# Patient Record
Sex: Female | Born: 1990 | Race: White | Hispanic: No | Marital: Single
Health system: Southern US, Community
[De-identification: ages and names within clinical notes are randomized; demographics above are authoritative.]

## PROBLEM LIST (undated history)

## (undated) ENCOUNTER — Emergency Department (HOSPITAL_COMMUNITY): Payer: Self-pay | Source: Home / Self Care

---
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
## 2008-03-07 ENCOUNTER — Emergency Department: Payer: Self-pay | Admitting: Emergency Medicine

## 2008-04-11 ENCOUNTER — Emergency Department: Payer: Self-pay | Admitting: Emergency Medicine

## 2009-01-13 ENCOUNTER — Observation Stay: Payer: Self-pay

## 2009-01-15 ENCOUNTER — Observation Stay: Payer: Self-pay

## 2009-01-18 ENCOUNTER — Inpatient Hospital Stay: Payer: Self-pay

## 2009-11-22 ENCOUNTER — Emergency Department: Payer: Self-pay | Admitting: Unknown Physician Specialty

## 2010-04-08 ENCOUNTER — Ambulatory Visit: Payer: Self-pay | Admitting: Family Medicine

## 2012-03-31 ENCOUNTER — Emergency Department: Payer: Self-pay | Admitting: Emergency Medicine

## 2012-03-31 LAB — URINALYSIS, COMPLETE
Bacteria: NONE SEEN
Glucose,UR: NEGATIVE mg/dL (ref 0–75)
Leukocyte Esterase: NEGATIVE
Nitrite: NEGATIVE
RBC,UR: 4 /HPF (ref 0–5)
Squamous Epithelial: 1
WBC UR: 2 /HPF (ref 0–5)

## 2012-03-31 LAB — CBC
MCH: 32.7 pg (ref 26.0–34.0)
MCHC: 34.7 g/dL (ref 32.0–36.0)
MCV: 94 fL (ref 80–100)
Platelet: 210 10*3/uL (ref 150–440)
RBC: 4.17 10*6/uL (ref 3.80–5.20)
RDW: 12.4 % (ref 11.5–14.5)

## 2012-11-08 ENCOUNTER — Ambulatory Visit: Payer: Self-pay | Admitting: Obstetrics and Gynecology

## 2012-11-08 LAB — CBC WITH DIFFERENTIAL/PLATELET
Basophil #: 0 10*3/uL (ref 0.0–0.1)
Eosinophil %: 1.2 %
HCT: 33.1 % — ABNORMAL LOW (ref 35.0–47.0)
HGB: 11.5 g/dL — ABNORMAL LOW (ref 12.0–16.0)
Lymphocyte #: 2 10*3/uL (ref 1.0–3.6)
Lymphocyte %: 20.6 %
Monocyte #: 0.5 x10 3/mm (ref 0.2–0.9)
Monocyte %: 5.6 %
Neutrophil #: 6.9 10*3/uL — ABNORMAL HIGH (ref 1.4–6.5)
Neutrophil %: 72.4 %
Platelet: 293 10*3/uL (ref 150–440)
RBC: 3.76 10*6/uL — ABNORMAL LOW (ref 3.80–5.20)
RDW: 13.8 % (ref 11.5–14.5)

## 2012-11-09 ENCOUNTER — Inpatient Hospital Stay: Payer: Self-pay | Admitting: Obstetrics and Gynecology

## 2012-11-10 LAB — HEMATOCRIT: HCT: 27.4 % — ABNORMAL LOW (ref 35.0–47.0)

## 2013-07-28 ENCOUNTER — Emergency Department: Payer: Self-pay | Admitting: Emergency Medicine

## 2013-07-28 LAB — COMPREHENSIVE METABOLIC PANEL
ALT: 18 U/L (ref 12–78)
Albumin: 4.3 g/dL (ref 3.4–5.0)
Alkaline Phosphatase: 48 U/L
Anion Gap: 7 (ref 7–16)
BUN: 10 mg/dL (ref 7–18)
Bilirubin,Total: 0.4 mg/dL (ref 0.2–1.0)
CALCIUM: 8.8 mg/dL (ref 8.5–10.1)
CO2: 24 mmol/L (ref 21–32)
Chloride: 109 mmol/L — ABNORMAL HIGH (ref 98–107)
Creatinine: 0.69 mg/dL (ref 0.60–1.30)
EGFR (Non-African Amer.): >60
Glucose: 114 mg/dL — ABNORMAL HIGH (ref 65–99)
OSMOLALITY: 279 (ref 275–301)
Potassium: 3.9 mmol/L (ref 3.5–5.1)
SGOT(AST): 11 U/L — ABNORMAL LOW (ref 15–37)
SODIUM: 140 mmol/L (ref 136–145)
Total Protein: 7 g/dL (ref 6.4–8.2)

## 2013-07-28 LAB — URINALYSIS, COMPLETE
BILIRUBIN, UR: NEGATIVE
BLOOD: NEGATIVE
Bacteria: NONE SEEN
GLUCOSE, UR: NEGATIVE mg/dL (ref 0–75)
LEUKOCYTE ESTERASE: NEGATIVE
NITRITE: NEGATIVE
Ph: 6 (ref 4.5–8.0)
Protein: NEGATIVE
RBC,UR: <1 /HPF (ref 0–5)
SPECIFIC GRAVITY: 1.02 (ref 1.003–1.030)
Squamous Epithelial: 2

## 2013-07-28 LAB — CBC WITH DIFFERENTIAL/PLATELET
BASOS PCT: 0.5 %
Basophil #: 0 10*3/uL (ref 0.0–0.1)
Eosinophil #: 0.2 10*3/uL (ref 0.0–0.7)
Eosinophil %: 2.8 %
HCT: 43.1 % (ref 35.0–47.0)
HGB: 14.7 g/dL (ref 12.0–16.0)
Lymphocyte #: 2.3 10*3/uL (ref 1.0–3.6)
Lymphocyte %: 35.6 %
MCH: 31.8 pg (ref 26.0–34.0)
MCHC: 34 g/dL (ref 32.0–36.0)
MCV: 93 fL (ref 80–100)
MONOS PCT: 4.7 %
Monocyte #: 0.3 x10 3/mm (ref 0.2–0.9)
NEUTROS PCT: 56.4 %
Neutrophil #: 3.6 10*3/uL (ref 1.4–6.5)
Platelet: 220 10*3/uL (ref 150–440)
RBC: 4.62 10*6/uL (ref 3.80–5.20)
RDW: 12.7 % (ref 11.5–14.5)
WBC: 6.4 10*3/uL (ref 3.6–11.0)

## 2013-07-28 LAB — LIPASE, BLOOD: Lipase: 98 U/L (ref 73–393)

## 2013-07-28 LAB — WET PREP, GENITAL

## 2013-11-18 IMAGING — US US OB < 14 WEEKS - US OB TV
1 series · 14 of 28 positions shown · non-contrast
Comparison: none

REASON FOR EXAM: new diagnosis of pregnancy, pelvic pain vaginal bleeding
COMMENTS:

PROCEDURE:     US  - US OB LESS THAN 14 WEEKS/W TRANS  - March 31, 2012  [DATE]
RESULT:     Comparison: None
TECHNIQUE: Multiple transabdominal gray-scale images and endovaginal
gray-scale images of the pelvis performed.

[Series 1: us ob < 14 weeks - us ob tv · 0.21mm/px · 14 of 39 slices shown]
[im 2/39]
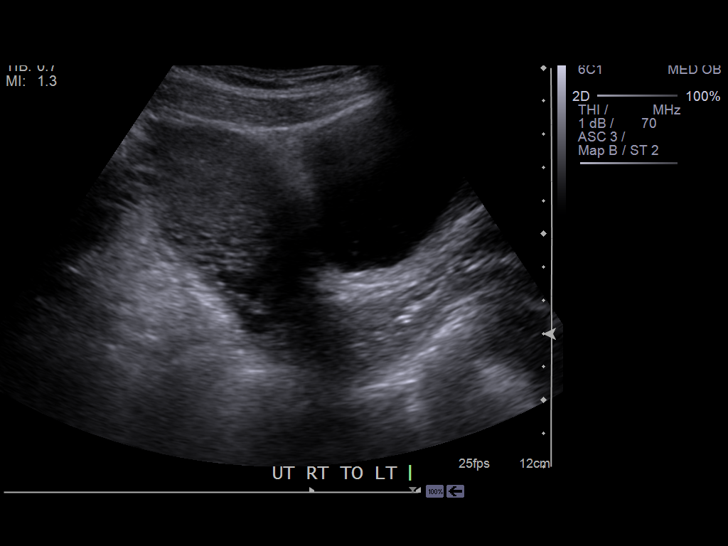
[im 5/39]
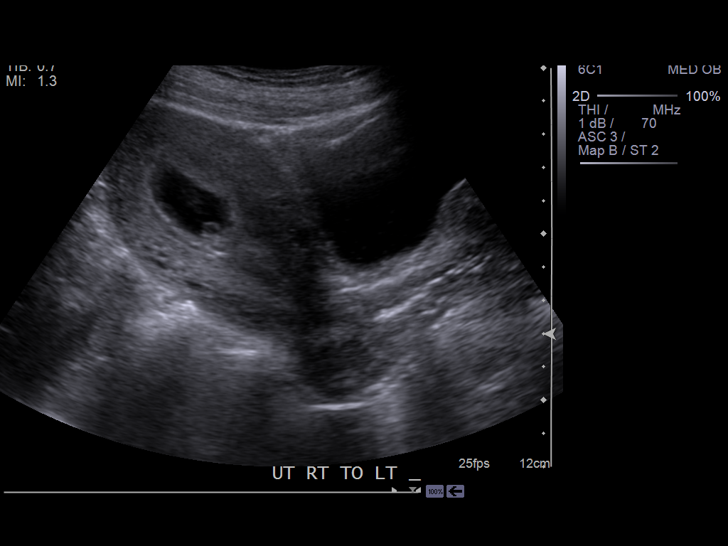
[im 8/39]
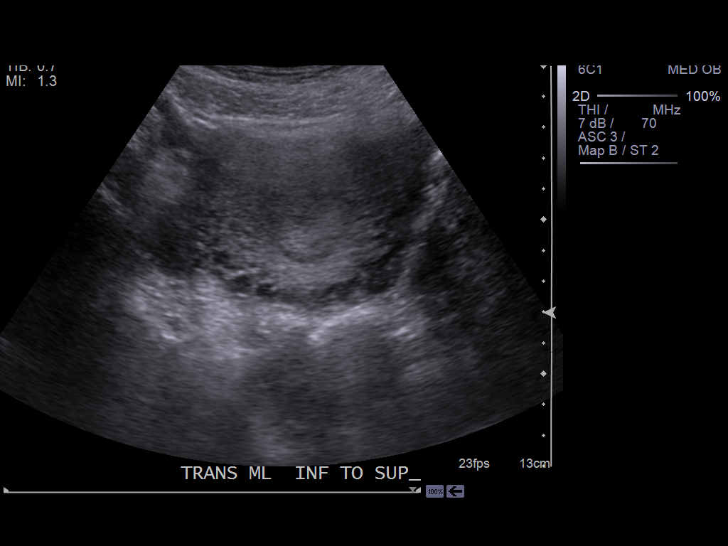
[im 10/39]
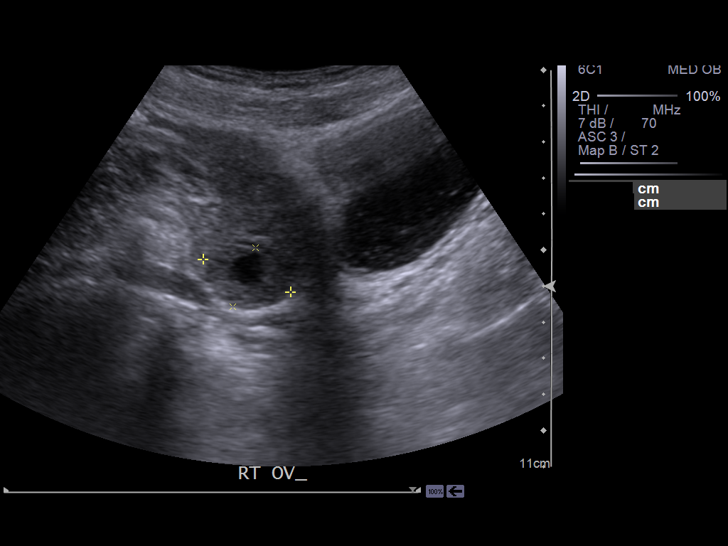
[im 13/39]
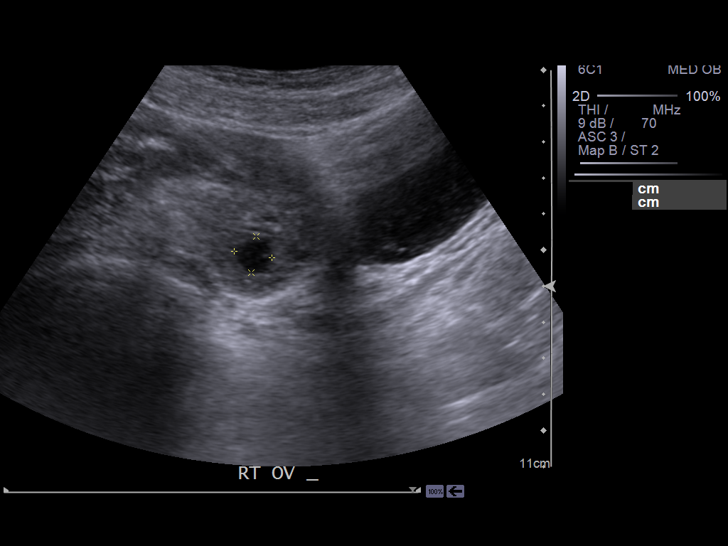
[im 16/39]
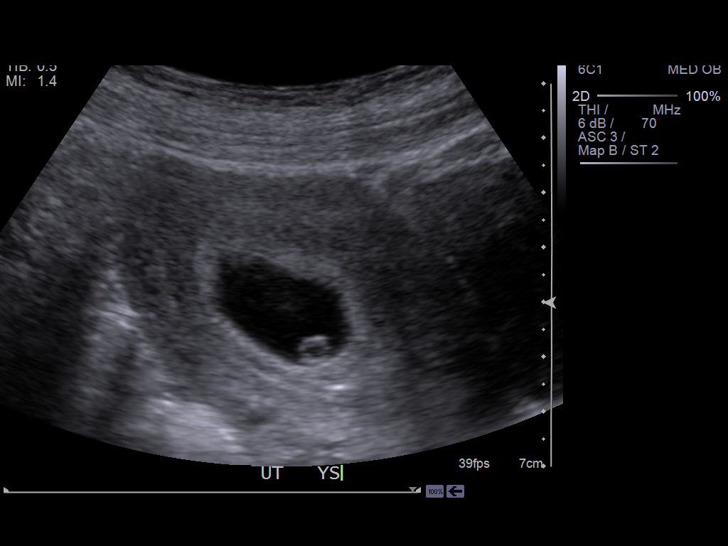
[im 19/39]
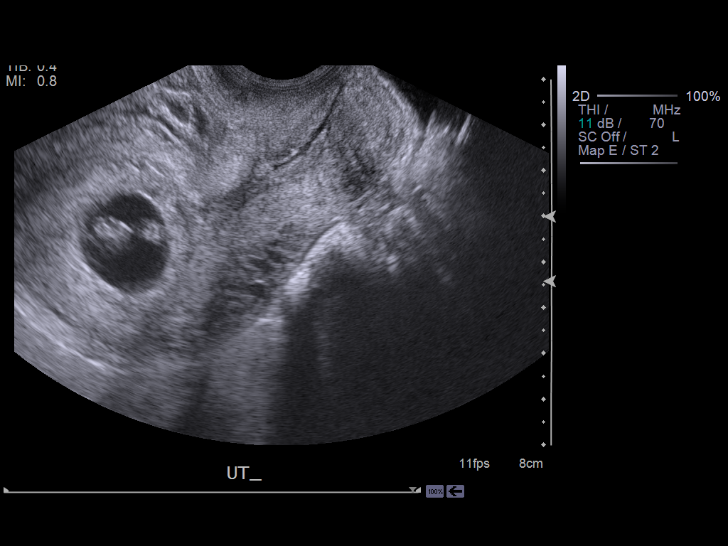
[im 22/39]
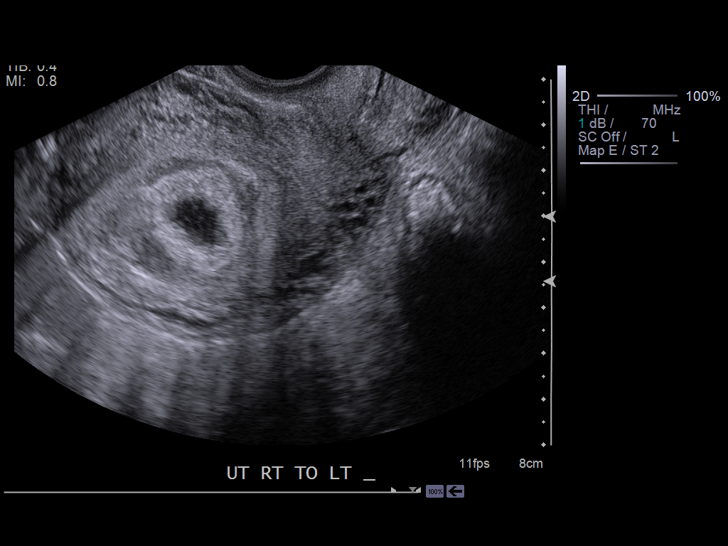
[im 24/39]
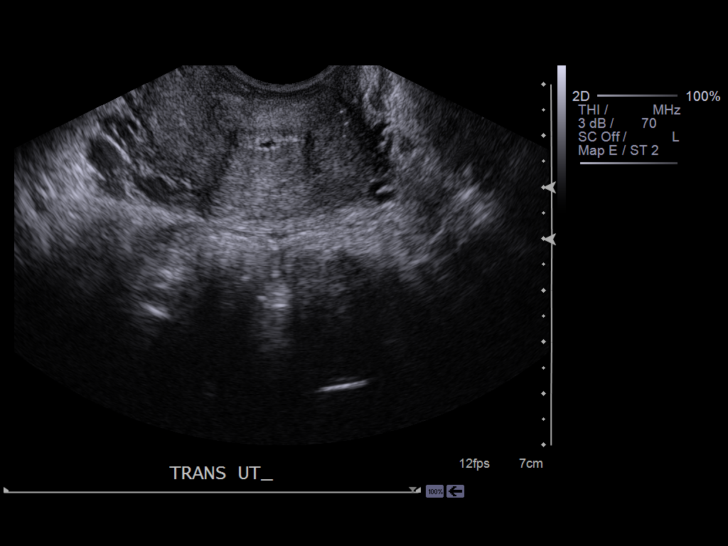
[im 27/39]
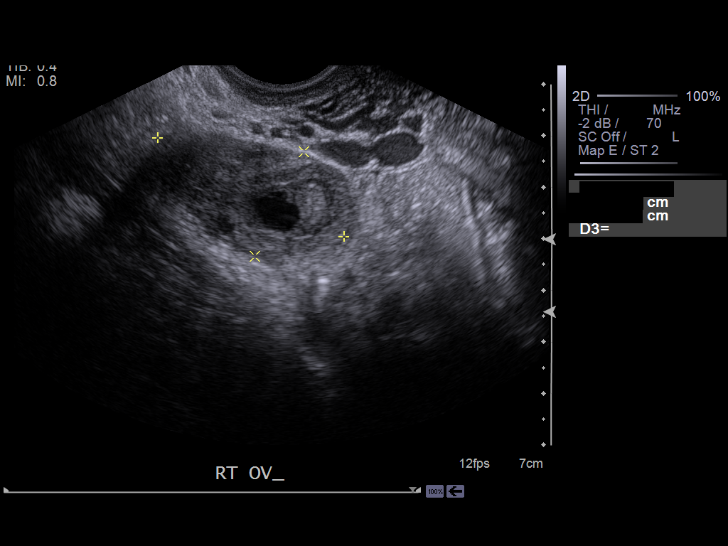
[im 30/39]
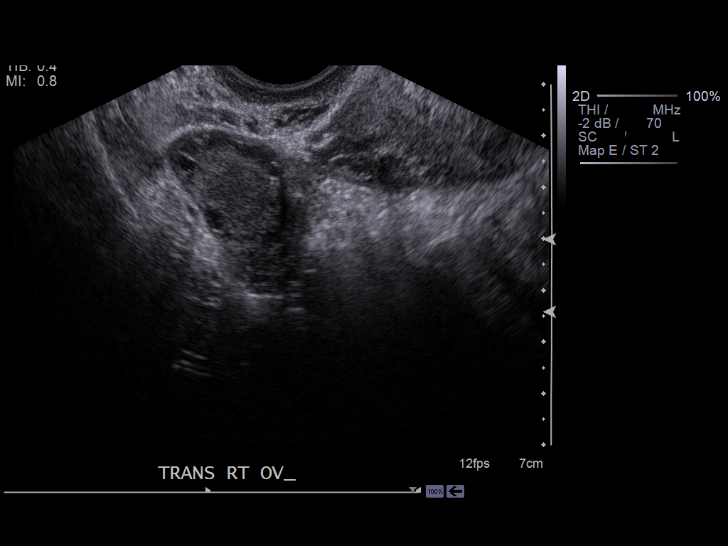
[im 33/39]
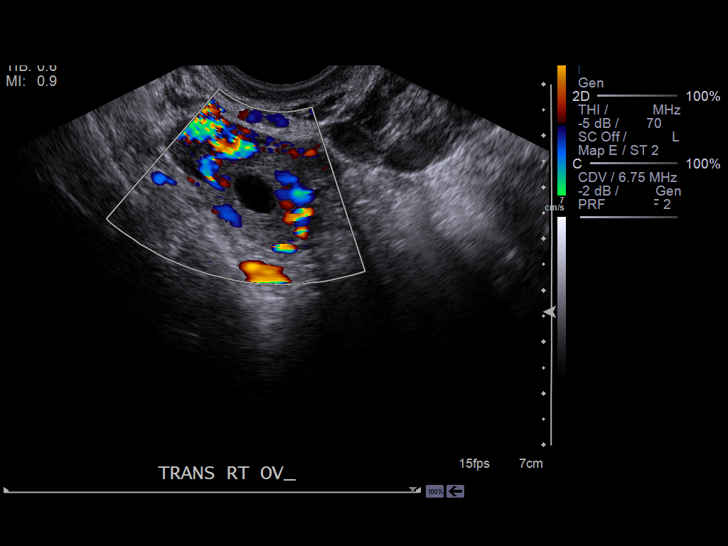
[im 36/39]
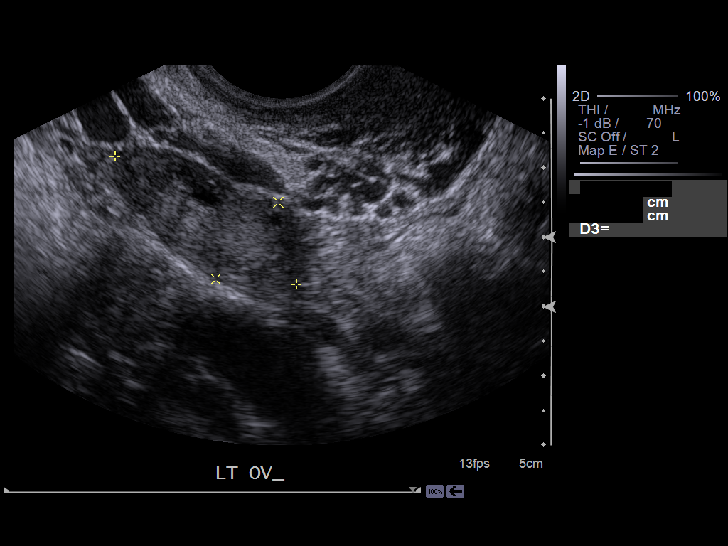
[im 39/39]
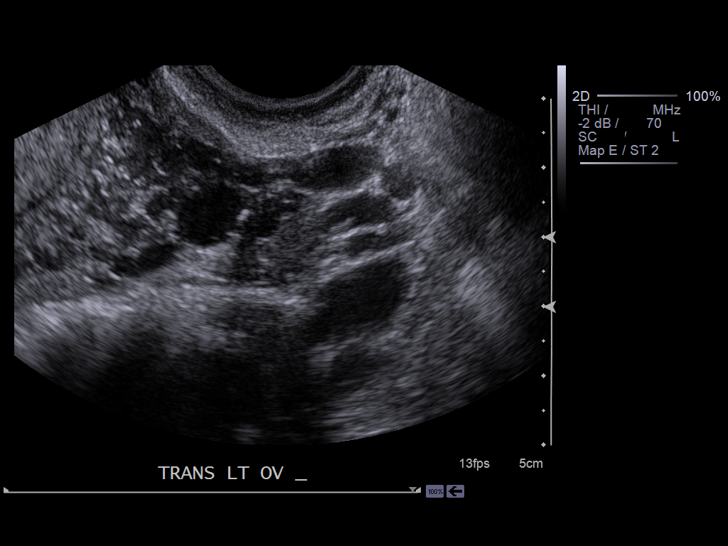

[14 of 28 positions shown; findings below may reference images not displayed]

FINDINGS: There is a single live intrauterine pregnancy visualized with a crown-rump
length of 1.14 cm dating the pregnancy at 7 weeks 2 days. There is a normal
yolk sac. There is a normal fetal heart rate of 137 beats per minute. There
is a trace amount of fluid in the cervical canal.

The right ovary measures 4.1 x 2 x 2.2 cm.  The left ovary measures 3.2 x
1.2 x 1.4 cm.  There is a 2.4 x 1.4 x 2 cm hypoechoic right ovarian mass
likely resenting a corpus luteum cyst.

There is no pelvic free fluid.
IMPRESSION: Single live intrauterine pregnancy dating 7 weeks 2 days with an estimated
due date of 11/15/2012.

[REDACTED]

## 2014-06-20 NOTE — Op Note (Signed)
PATIENT NAME:  Elizabeth Small, Elizabeth Small MR#:  [id number] DATE OF BIRTH:  Aug 15, 1990  DATE OF PROCEDURE:  11/09/2012  PREOPERATIVE DIAGNOSIS:  Repeat cesarean, term.   POSTOPERATIVE DIAGNOSIS:  Repeat cesarean, term.  PROCEDURE: Secondary intrauterine pregnancy cesarean section.   ESTIMATED BLOOD LOSS: 1000 mL.   FINDINGS: Term liveborn infant, with normal uterus, tubes and ovaries, with Small very, very thin lower uterine segment and small window.   SURGEON: Elliot Gurneyarrie C Daouda Lonzo, MD   ASSISTANT: Dr. Greggory StallionSteve Jackson  DESCRIPTION OF PROCEDURE: The patient was taken to the Operating Room and placed in supine position. After adequate spinal anesthesia was instilled, the patient was prepped and draped in the usual sterile fashion. Small timeout was performed. Small Pfannenstiel skin incision was made approximately 3 fingerbreadths above the pubic symphysis, and the old scar was cut out of the tissue. The incision was carried sharply down to the fascia. The fascia was nicked in the midline. The incision was extended superolaterally. The muscle bellies were  sharply and bluntly dissected off the rectus fascia. The muscle bellies were identified. The midline was seen. The peritoneum was grasped. The midline was entered. The peritoneum was cut and entered. Bladder blade was placed. Bladder flap was created with what was left of the (Dictation Anomaly)bladder flap that could be removed from the reflection. However, there was no muscularis underneath the bladder to separate, so the bladder was removed from the uterus incision, and the uterus was then entered. The infant's head was delivered. The infant was bulb suctioned. The cord was clamped and cut. Cord blood was obtained. Pitocin was started. The placenta was delivered. The uterus was delivered and wrapped in Small moist lap sponge. The interior of the uterus was curetted with Small moist laparotomy sponge. The uterine incision was then closed with Small running locked chromic and then Small  running imbricating suture. Muscularis was dissected off the bladder, where it had fallen back, and it was carried back up to the incision so as to reinforce the incision. The patient was told that she would never be allowed to labor. The bladder was then tacked back up to the uterine incision. The belly was cleared of clots. The uterus was placed back into the abdomen. The muscle was closed with Small running Vicryl suture. The On-Q pain pump trocars were placed. The fascia was closed with 2 running sutures of Vicryl, closed in the midline, and the skin incision was closed with Small running 4-0 Monocryl. The catheters of the On-Q pain pump were wrapped together and placed inside of Small 4 x 4. This was applied to the skin with Tegaderm. Benzoin and Steri-Strips were placed along the skin incision. Telfa and 4 x 4's were placed. Small bandage was placed. The uterus was massaged, found to be clear of clots. The urine was clear, and the patient was laid supine and taken to recovery, after having tolerated the procedure well.      ____________________________ Elliot Gurneyarrie C. Tylisa Alcivar, MD cck:mr D: 11/14/2012 15:40:00 ET T: 11/14/2012 20:38:37 ET JOB#: [id number]  cc: Elliot Gurneyarrie C. Zadrian Mccauley, MD, <Dictator>   Elliot GurneyARRIE C Kooper Godshall MD ELECTRONICALLY SIGNED 11/17/2012 16:48
